# Patient Record
Sex: Female | Born: 1980 | Race: White | Hispanic: No | Smoking: Current every day smoker
Health system: Southern US, Community
[De-identification: ages and names within clinical notes are randomized; demographics above are authoritative.]

---
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
## 2001-12-15 ENCOUNTER — Emergency Department (HOSPITAL_COMMUNITY): Admission: EM | Admit: 2001-12-15 | Discharge: 2001-12-15 | Payer: Self-pay | Admitting: Emergency Medicine

## 2002-07-27 ENCOUNTER — Inpatient Hospital Stay (HOSPITAL_COMMUNITY): Admission: AD | Admit: 2002-07-27 | Discharge: 2002-07-28 | Payer: Self-pay | Admitting: Family Medicine

## 2003-07-13 ENCOUNTER — Emergency Department (HOSPITAL_COMMUNITY): Admission: EM | Admit: 2003-07-13 | Discharge: 2003-07-13 | Payer: Self-pay | Admitting: *Deleted

## 2003-09-13 ENCOUNTER — Emergency Department (HOSPITAL_COMMUNITY): Admission: EM | Admit: 2003-09-13 | Discharge: 2003-09-13 | Payer: Self-pay | Admitting: Emergency Medicine

## 2003-09-17 ENCOUNTER — Emergency Department (HOSPITAL_COMMUNITY): Admission: EM | Admit: 2003-09-17 | Discharge: 2003-09-17 | Payer: Self-pay | Admitting: Family Medicine

## 2003-09-18 ENCOUNTER — Emergency Department (HOSPITAL_COMMUNITY): Admission: EM | Admit: 2003-09-18 | Discharge: 2003-09-18 | Payer: Self-pay | Admitting: Family Medicine

## 2003-09-20 ENCOUNTER — Emergency Department (HOSPITAL_COMMUNITY): Admission: EM | Admit: 2003-09-20 | Discharge: 2003-09-20 | Payer: Self-pay | Admitting: Family Medicine

## 2003-12-19 ENCOUNTER — Emergency Department: Payer: Self-pay | Admitting: Internal Medicine

## 2003-12-27 ENCOUNTER — Emergency Department: Payer: Self-pay | Admitting: Emergency Medicine

## 2004-01-07 ENCOUNTER — Inpatient Hospital Stay (HOSPITAL_COMMUNITY): Admission: AD | Admit: 2004-01-07 | Discharge: 2004-01-07 | Payer: Self-pay | Admitting: *Deleted

## 2004-02-25 ENCOUNTER — Ambulatory Visit: Payer: Self-pay

## 2004-06-24 ENCOUNTER — Observation Stay: Payer: Self-pay

## 2004-06-30 ENCOUNTER — Observation Stay: Payer: Self-pay | Admitting: Obstetrics and Gynecology

## 2004-07-05 ENCOUNTER — Observation Stay: Payer: Self-pay | Admitting: Obstetrics and Gynecology

## 2004-07-06 ENCOUNTER — Observation Stay: Payer: Self-pay | Admitting: Obstetrics and Gynecology

## 2004-07-08 ENCOUNTER — Inpatient Hospital Stay: Payer: Self-pay

## 2004-09-20 ENCOUNTER — Ambulatory Visit: Payer: Self-pay

## 2004-11-26 ENCOUNTER — Other Ambulatory Visit: Payer: Self-pay

## 2004-11-26 ENCOUNTER — Emergency Department: Payer: Self-pay | Admitting: Internal Medicine

## 2005-01-01 ENCOUNTER — Emergency Department: Payer: Self-pay | Admitting: Emergency Medicine

## 2005-05-27 ENCOUNTER — Ambulatory Visit: Payer: Self-pay | Admitting: Family Medicine

## 2005-10-11 ENCOUNTER — Emergency Department: Payer: Self-pay | Admitting: Emergency Medicine

## 2007-05-13 IMAGING — CT CT ABD-PELV W/O CM
1 of 2 series · 15 of 32 positions shown, 19 images · non-contrast
Comparison: none

REASON FOR EXAM: Abdomen and pelvis, evaluate for stones
COMMENTS:

[Series 2: stone · axial · 0.59mm/px · z∈[-1037,-647]mm · 15 of 147 slices shown, 19 images]
[im 11/147  soft-tissue]
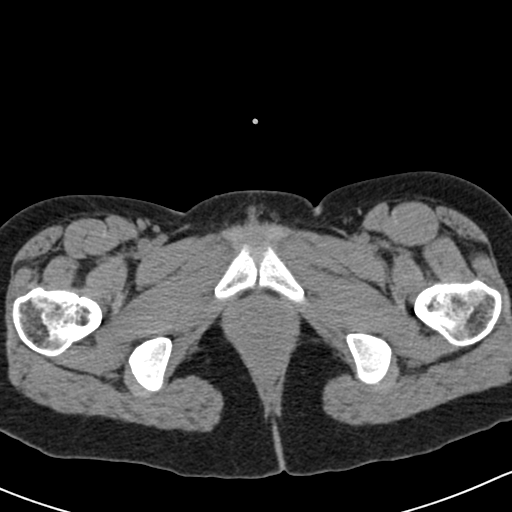
[im 11/147  bone]
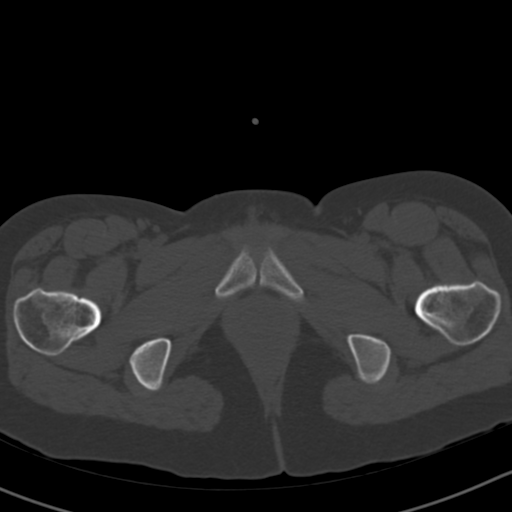
[im 21/147  soft-tissue]
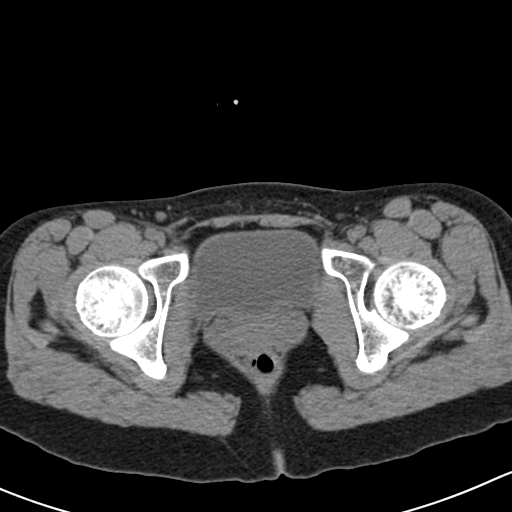
[im 32/147  soft-tissue]
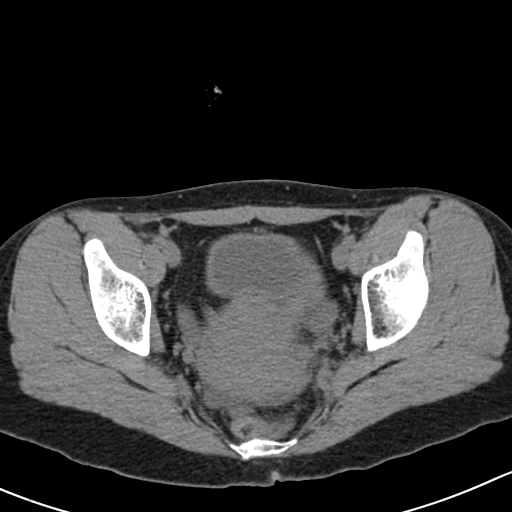
[im 42/147  soft-tissue]
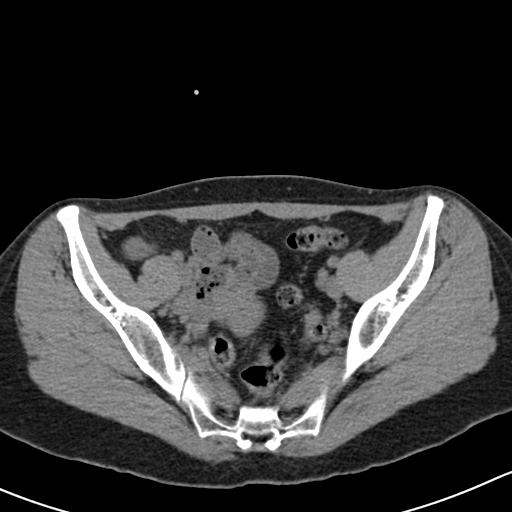
[im 53/147  soft-tissue]
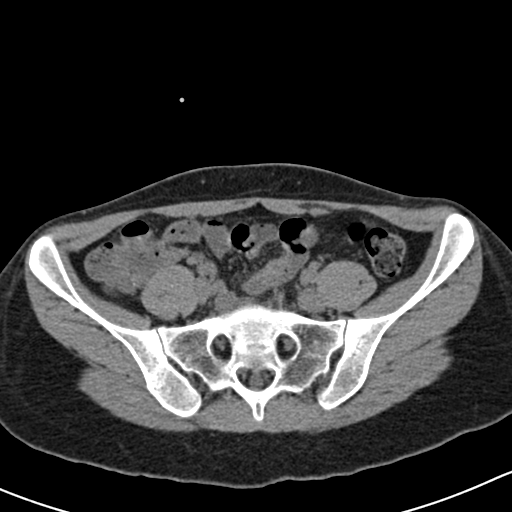
[im 63/147  soft-tissue]
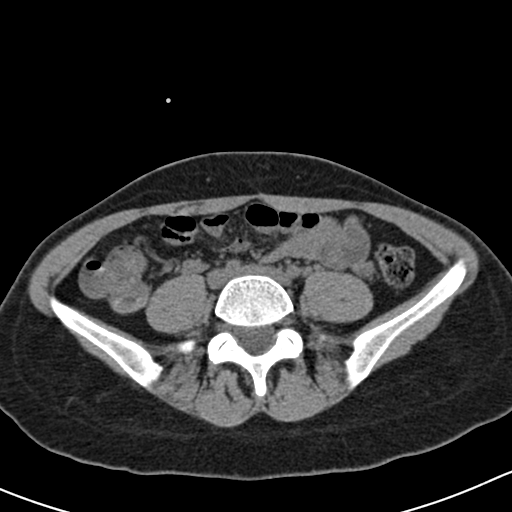
[im 74/147  soft-tissue]
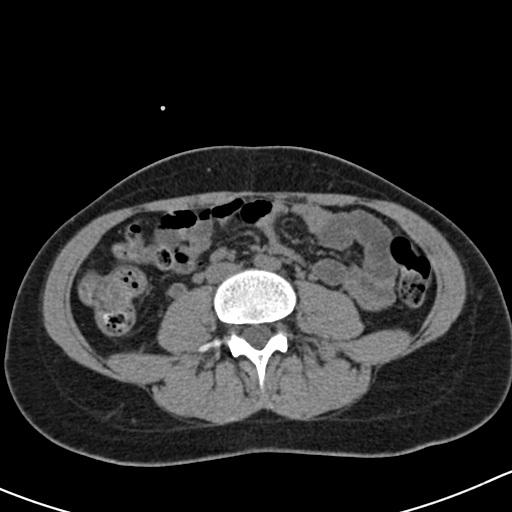
[im 84/147  soft-tissue]
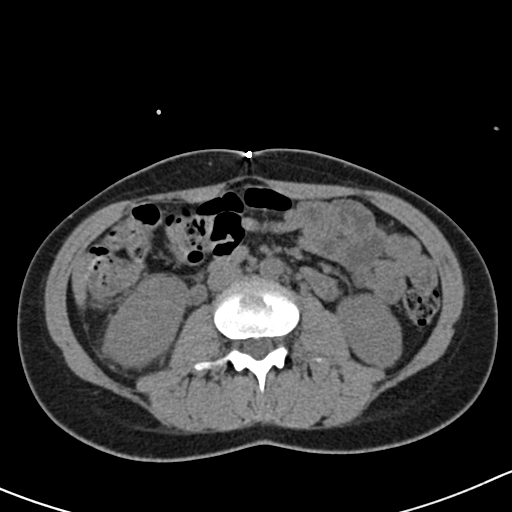
[im 94/147  soft-tissue]
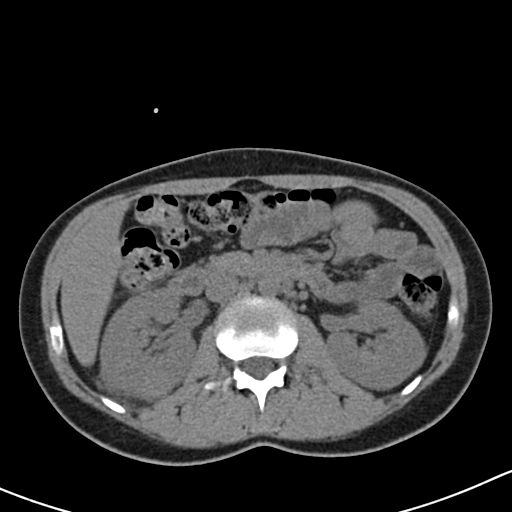
[im 94/147  bone]
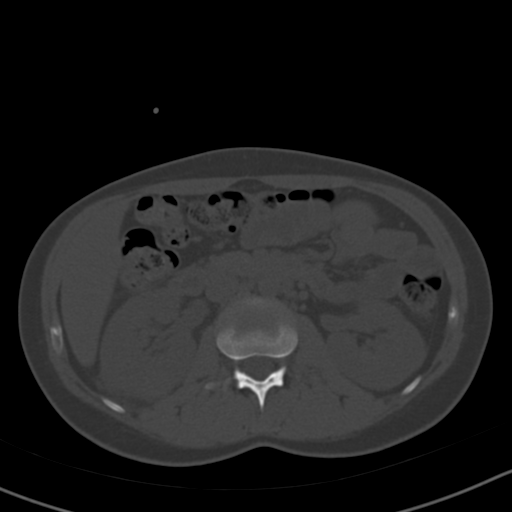
[im 105/147  soft-tissue]
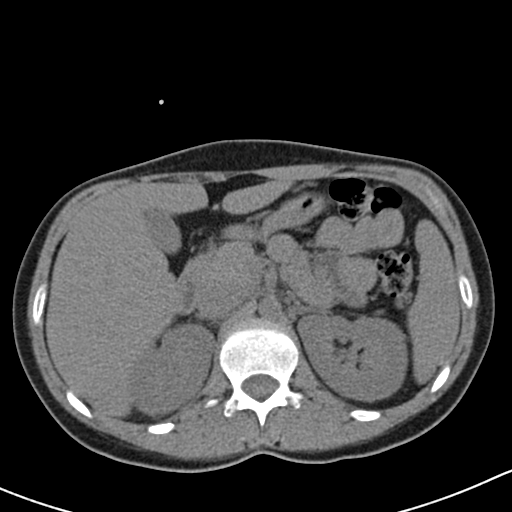
[im 115/147  soft-tissue]
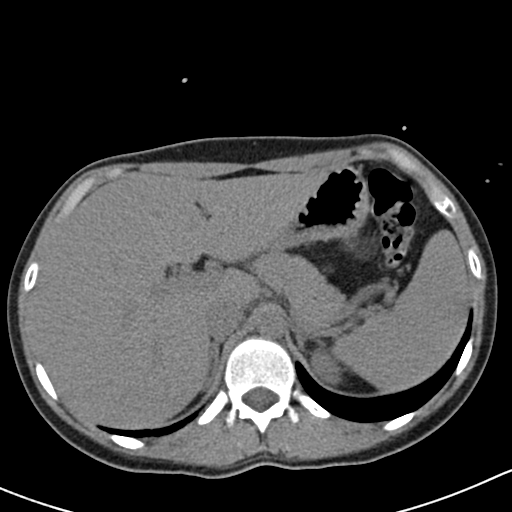
[im 126/147  soft-tissue]
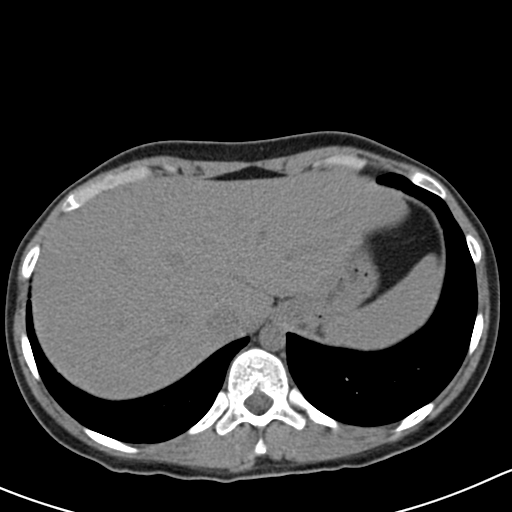
[im 126/147  lung]
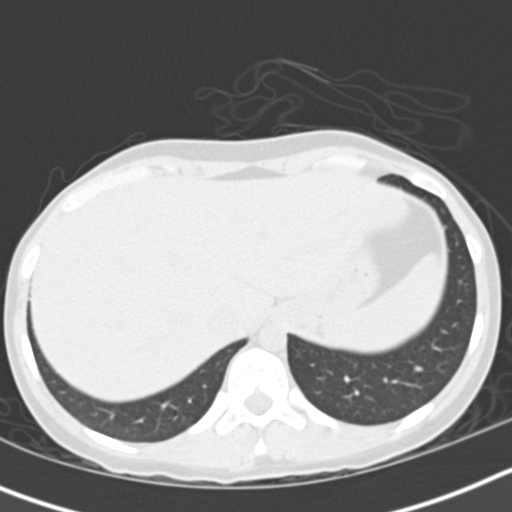
[im 131/147  lung]
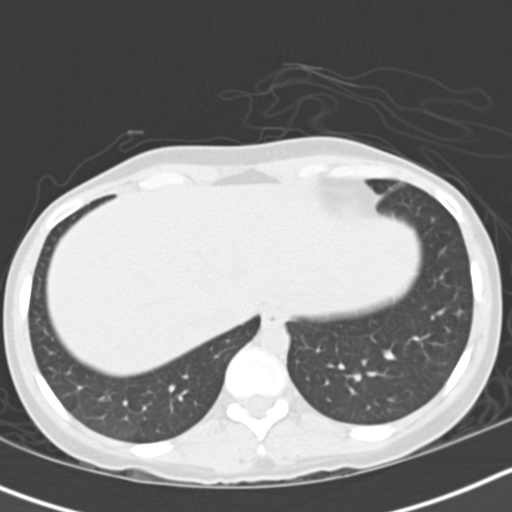
[im 136/147  soft-tissue]
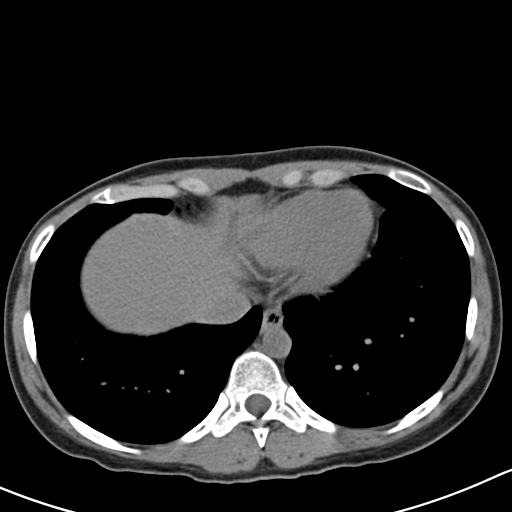
[im 136/147  lung]
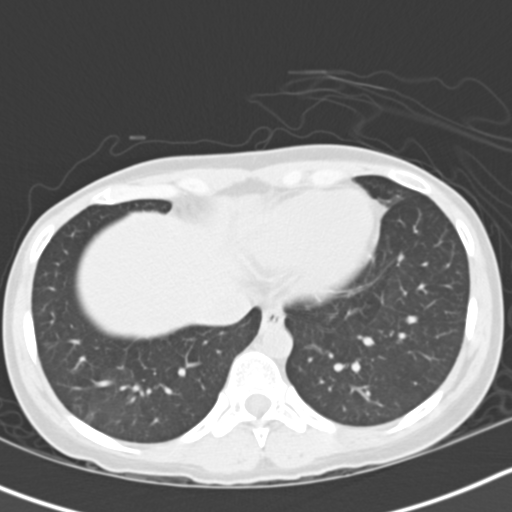
[im 141/147  lung]
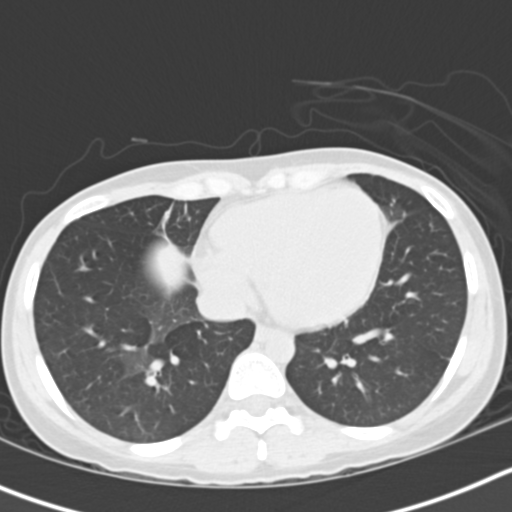

[15 of 32 positions shown; findings below may reference images not displayed]

PROCEDURE:     CT  - CT ABDOMEN AND PELVIS W[DATE]  [DATE]

RESULT:          The patient is complaining of LEFT flank discomfort as well
as dysuria.  The study was tailored to evaluate the patient for urinary
tract stones.

Both RIGHT and LEFT kidneys appear normal in contour.  I do not see evidence
of hydronephrosis.  The perinephric fat density is minimally prominent on
the RIGHT, but on the LEFT it is normal.  Neither kidney exhibits calcified
stones.  Along the expected course of the ureters, I do not see
calcifications nor hydroureter.  The urinary bladder is partially distended
and grossly normal.  The uterus and adnexal structures appear normal for
age.  There is no free fluid in the pelvis.  The unopacified loops of small
and large bowel exhibit no acute abnormality.  I do not see findings
suspicious for acute appendicitis.

The liver, gallbladder, stomach, spleen, adrenal glands, and abdominal aorta
are normal in appearance.  The lung bases are clear.
IMPRESSION: There is very mildly increased density in the
perinephric fat on the RIGHT, but I do not see evidence of hydronephrosis.
Minimal prominence of the proximal RIGHT ureter is noted, but no definite
calcified stone is seen.  A definite transition point in the ureter is not
identified, either.  This may reflect edema from recent passage of a stone
or may reflect unopacified stones.  Correlation with the patient's clinical
exam will be needed.  A followup contrast-enhanced CT scan is available upon
request.

## 2007-07-11 ENCOUNTER — Inpatient Hospital Stay (HOSPITAL_COMMUNITY): Admission: AD | Admit: 2007-07-11 | Discharge: 2007-07-12 | Payer: Self-pay | Admitting: Obstetrics and Gynecology

## 2010-06-04 NOTE — Discharge Summary (Signed)
[facility]  Patient:    Brenda Baker, Brenda Baker                         MRN: [id number] Adm. Date:  [id number] Disc. Date: [id number] Attending:  Tammi Sou CC:         [facility]   Discharge Summary  DATE OF BIRTH:                Dec 01, 1980  REASON FOR ADMISSION:         Preterm labor.  HISTORY OF PRESENT ILLNESS:   This is a 30 year old, G2, P1-0-0-1, at 33 weeks on the date of admission, who reported to [facility] with a history of contractions every two to five minutes for two and a half hours the night of admission. The patients contractions decreased, but she wanted to be checked out.  She was previously seen in [facility]U for preterm contractions and was given subcu terbutaline and IV fluids.  They subsided and she was discharged to home.  The patient did not have any spontaneous rupture of membranes or any bleeding. She did have positive fetal movement and there was no discharge.  OBSTETRICAL HISTORY:          Significant for one prior spontaneous vaginal delivery without complications.  SOCIAL HISTORY:               Negative for alcohol.  She does smoke one pack of cigarettes per day.  She does not use any illicit drugs.  GYNECOLOGICAL HISTORY:        Negative.  PAST MEDICAL HISTORY:         Negative.  PAST SURGICAL HISTORY:        Negative.  MEDICATIONS:                  Iron and prenatal vitamins.  ALLERGIES:                    She has no known drug allergies.  PRENATAL LABORATORY DATA:     Normal.  PHYSICAL EXAMINATION:         Vital signs stable.  She was afebrile.  The fetal heart rate at baseline was 130-140.  Positive accelerations and no decelerations.  She was having uterine contractions every five minutes.  HEENT:                        Benign.  CHEST:                        Benign.  HEART:                        Benign.  ABDOMEN:                      Benign.  PELVIC:                       The cervical exam revealed her  to be 2-3 cm, 30%, -3, and vertex.  This was a change from the exam earlier in the day, which showed her to be fingertip.  Wet prep on admission showed moderate wbcs and all else was negative.  HOSPITAL COURSE:              The patient was admitted to the antenatal unit for continuous  monitoring.  She was started on IV magnesium sulfate for tocolysis.  She did have a protracted course on magnesium sulfate, including changes in the dosage from 2g to 3 g multiple times due to symptoms related to the magnesium and elevated magnesium levels.  She was tried off of her magnesium sulfate one time during the course, at which time she began contracting and again changed her cervix to an exam of 4-5 cm, 50%, and -3. She was restarted on magnesium tocolysis with good control of her contractions.  Throughout her hospitalization, she continued to have some contractions, but did not have any abdominal pain, pelvic pressure, or back pain as she did with her initial set of contractions.  The patient was also started on IV Unasyn for cervicitis due to her moderate white blood cells on wet prep.  During her hospitalization, the patient was placed on a 7 mcg nicotine patch due to her smoking in the bathroom several times and requesting to go outside to smoke.  Due to the patients cervical change, it was felt that this was not in her best interest.  The patient did become compliant with her course with the patches and no further incidents were reported.  The patient also received betamethasone x 2.  After the patients gestational age reached 6, it was felt that given her having received prenatal steroids and reaching this gestational age that if she were to go into active labor, no further precautions will be taken to stop her.  She was therefore discontinued from magnesium sulfate and was monitored b.i.d.  She did have several episodes of contractions with abdominal pain, back pain, and pelvic pressure, but  did not change her cervix with these.  The patient was monitored routinely and while still continuing to have contractions is not longer feeling them. Therefore, she is discharged home without tocolysis.  She was given instructions on labor and when to return to the clinic.  She is to follow up for routine care at high-risk clinic in one week.  PROCEDURES:                   A complete obstetrical ultrasound was performed, which showed polyhydramnios with an amniotic fluid index of 27.  No fetal anomalies were noted.  The infant was appropriate for gestational age.  DISCHARGE MEDICATIONS:        Prenatal vitamins and iron.  ACTIVITY:                     Bed rest with bathroom privileges.  DIET:                         Regular.  FOLLOW-UP:                    She is to follow up in the high-risk clinic in approximately one weeks time.  CONDITION ON DISCHARGE:       Stable and improved. DD:  09/16/99 TD:  09/17/99 Job: [id number]

## 2010-10-14 LAB — URINALYSIS, ROUTINE W REFLEX MICROSCOPIC
Bilirubin Urine: NEGATIVE
Glucose, UA: NEGATIVE
Ketones, ur: NEGATIVE
Nitrite: NEGATIVE
Protein, ur: 100 — AB
Specific Gravity, Urine: 1.01
Urobilinogen, UA: 1
pH: 6

## 2010-10-14 LAB — WET PREP, GENITAL
Trich, Wet Prep: NONE SEEN
Yeast Wet Prep HPF POC: NONE SEEN

## 2010-10-14 LAB — POCT PREGNANCY, URINE
Operator id: 14935
Preg Test, Ur: NEGATIVE

## 2010-10-14 LAB — URINE MICROSCOPIC-ADD ON

## 2017-05-24 ENCOUNTER — Encounter: Payer: Self-pay | Admitting: Psychiatry

## 2017-05-24 ENCOUNTER — Other Ambulatory Visit: Payer: Self-pay

## 2017-05-24 ENCOUNTER — Inpatient Hospital Stay
Admission: AD | Admit: 2017-05-24 | Discharge: 2017-05-26 | DRG: 885 | Disposition: A | Discharge status: Home / Self Care | Payer: No Typology Code available for payment source | Source: Intra-hospital | Attending: Psychiatry | Admitting: Psychiatry

## 2017-05-24 DIAGNOSIS — F332 Major depressive disorder, recurrent severe without psychotic features: Principal | ICD-10-CM | POA: Diagnosis present

## 2017-05-24 DIAGNOSIS — N760 Acute vaginitis: Secondary | ICD-10-CM | POA: Diagnosis present

## 2017-05-24 DIAGNOSIS — F17200 Nicotine dependence, unspecified, uncomplicated: Secondary | ICD-10-CM | POA: Diagnosis present

## 2017-05-24 DIAGNOSIS — Z818 Family history of other mental and behavioral disorders: Secondary | ICD-10-CM | POA: Diagnosis not present

## 2017-05-24 DIAGNOSIS — R45851 Suicidal ideations: Secondary | ICD-10-CM | POA: Diagnosis present

## 2017-05-24 DIAGNOSIS — G4710 Hypersomnia, unspecified: Secondary | ICD-10-CM | POA: Diagnosis present

## 2017-05-24 DIAGNOSIS — F410 Panic disorder [episodic paroxysmal anxiety] without agoraphobia: Secondary | ICD-10-CM | POA: Diagnosis present

## 2017-05-24 DIAGNOSIS — F1010 Alcohol abuse, uncomplicated: Secondary | ICD-10-CM | POA: Diagnosis present

## 2017-05-24 DIAGNOSIS — Z5181 Encounter for therapeutic drug level monitoring: Secondary | ICD-10-CM

## 2017-05-24 MED ORDER — NICOTINE 21 MG/24HR TD PT24
21.0000 mg | MEDICATED_PATCH | Freq: Every day | TRANSDERMAL | Status: DC
  Administered 2017-05-25 – 2017-05-26 (×2): 21 mg via TRANSDERMAL
  Filled 2017-05-24 (×2): qty 1

## 2017-05-24 MED ORDER — HYDROXYZINE HCL 50 MG PO TABS
50.0000 mg | ORAL_TABLET | Freq: Three times a day (TID) | ORAL | Status: DC | PRN
  Administered 2017-05-24 – 2017-05-25 (×2): 50 mg via ORAL
  Filled 2017-05-24 (×2): qty 1

## 2017-05-24 MED ORDER — ALUM & MAG HYDROXIDE-SIMETH 200-200-20 MG/5ML PO SUSP: 30.0000 mL | ORAL | Status: DC | PRN

## 2017-05-24 MED ORDER — ACETAMINOPHEN 325 MG PO TABS: 650.0000 mg | ORAL_TABLET | Freq: Four times a day (QID) | ORAL | Status: DC | PRN

## 2017-05-24 MED ORDER — MAGNESIUM HYDROXIDE 400 MG/5ML PO SUSP: 30.0000 mL | Freq: Every day | ORAL | Status: DC | PRN

## 2017-05-24 MED ORDER — TRAZODONE HCL 100 MG PO TABS
100.0000 mg | ORAL_TABLET | Freq: Every day | ORAL | Status: DC
  Administered 2017-05-24: 100 mg via ORAL
  Filled 2017-05-24: qty 1

## 2017-05-24 NOTE — Plan of Care (Signed)
Patient newly admitted to unit, no goals met at this time.
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
## 2017-05-25 ENCOUNTER — Encounter: Payer: Self-pay | Admitting: Psychiatry

## 2017-05-25 DIAGNOSIS — F332 Major depressive disorder, recurrent severe without psychotic features: Principal | ICD-10-CM

## 2017-05-25 DIAGNOSIS — F17200 Nicotine dependence, unspecified, uncomplicated: Secondary | ICD-10-CM | POA: Diagnosis present

## 2017-05-25 LAB — CBC
HCT: 39.3 % (ref 35.0–47.0)
Hemoglobin: 13.7 g/dL (ref 12.0–16.0)
MCH: 32.2 pg (ref 26.0–34.0)
MCHC: 35 g/dL (ref 32.0–36.0)
MCV: 92 fL (ref 80.0–100.0)
Platelets: 311 10*3/uL (ref 150–440)
RBC: 4.27 MIL/uL (ref 3.80–5.20)
RDW: 14.2 % (ref 11.5–14.5)
WBC: 8.9 10*3/uL (ref 3.6–11.0)

## 2017-05-25 LAB — COMPREHENSIVE METABOLIC PANEL
ALT: 13 U/L — ABNORMAL LOW (ref 14–54)
ANION GAP: 6 (ref 5–15)
AST: 22 U/L (ref 15–41)
Albumin: 4.3 g/dL (ref 3.5–5.0)
Alkaline Phosphatase: 61 U/L (ref 38–126)
BILIRUBIN TOTAL: 0.6 mg/dL (ref 0.3–1.2)
BUN: 7 mg/dL (ref 6–20)
CO2: 29 mmol/L (ref 22–32)
Calcium: 8.9 mg/dL (ref 8.9–10.3)
Chloride: 103 mmol/L (ref 101–111)
Creatinine, Ser: 0.73 mg/dL (ref 0.44–1.00)
Glucose, Bld: 80 mg/dL (ref 65–99)
POTASSIUM: 3.2 mmol/L — AB (ref 3.5–5.1)
Sodium: 138 mmol/L (ref 135–145)
TOTAL PROTEIN: 7.4 g/dL (ref 6.5–8.1)

## 2017-05-25 LAB — URINALYSIS, COMPLETE (UACMP) WITH MICROSCOPIC
BILIRUBIN URINE: NEGATIVE
GLUCOSE, UA: NEGATIVE mg/dL
Hgb urine dipstick: NEGATIVE
KETONES UR: NEGATIVE mg/dL
LEUKOCYTES UA: NEGATIVE
NITRITE: POSITIVE — AB
PH: 6 (ref 5.0–8.0)
Protein, ur: NEGATIVE mg/dL
Specific Gravity, Urine: 1.003 — ABNORMAL LOW (ref 1.005–1.030)

## 2017-05-25 LAB — LIPID PANEL
Cholesterol: 168 mg/dL (ref 0–200)
HDL: 32 mg/dL — ABNORMAL LOW (ref >40)
LDL CALC: 111 mg/dL — AB (ref 0–99)
TRIGLYCERIDES: 124 mg/dL (ref <150)
Total CHOL/HDL Ratio: 5.3 RATIO
VLDL: 25 mg/dL (ref 0–40)

## 2017-05-25 LAB — HEMOGLOBIN A1C
HEMOGLOBIN A1C: 5.1 % (ref 4.8–5.6)
Mean Plasma Glucose: 99.67 mg/dL

## 2017-05-25 LAB — TSH: TSH: 2.226 u[IU]/mL (ref 0.350–4.500)

## 2017-05-25 MED ORDER — CEPHALEXIN 500 MG PO CAPS
500.0000 mg | ORAL_CAPSULE | Freq: Two times a day (BID) | ORAL | Status: DC
  Administered 2017-05-25 – 2017-05-26 (×2): 500 mg via ORAL
  Filled 2017-05-25 (×2): qty 1

## 2017-05-25 MED ORDER — SERTRALINE HCL 50 MG PO TABS: 50.0000 mg | ORAL_TABLET | Freq: Every day | ORAL | 1 refills | Status: AC

## 2017-05-25 MED ORDER — SERTRALINE HCL 25 MG PO TABS
50.0000 mg | ORAL_TABLET | Freq: Every day | ORAL | Status: DC
  Administered 2017-05-25 – 2017-05-26 (×2): 50 mg via ORAL
  Filled 2017-05-25 (×2): qty 2

## 2017-05-25 MED ORDER — TRAZODONE HCL 100 MG PO TABS: 100.0000 mg | ORAL_TABLET | Freq: Every evening | ORAL | Status: DC | PRN

## 2017-05-25 MED ORDER — CEPHALEXIN 500 MG PO CAPS: 500.0000 mg | ORAL_CAPSULE | Freq: Two times a day (BID) | ORAL | 0 refills | Status: AC

## 2017-05-25 MED ORDER — HYDROXYZINE HCL 50 MG PO TABS: 50.0000 mg | ORAL_TABLET | Freq: Three times a day (TID) | ORAL | 1 refills | Status: AC | PRN

## 2017-05-25 MED ORDER — FLUCONAZOLE 100 MG PO TABS
200.0000 mg | ORAL_TABLET | Freq: Once | ORAL | Status: AC
  Administered 2017-05-25: 200 mg via ORAL
  Filled 2017-05-25: qty 2

## 2017-05-25 NOTE — Progress Notes (Signed)
Patient is alert and oriented x 4, denies distress or pain, denies SI/HI/AVH. Thoughts are organized and coherent. Patient is complaint with medication, interacting appropriately with peers and staff no distress noted. 15 minutes safety checks maintained will continue to monitor.
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
## 2017-05-25 NOTE — BHH Group Notes (Signed)
  05/25/2017  Time: 1PM  Type of Therapy/Topic:  Group Therapy:  Balance in Life  Participation Level:  Active  Description of Group:   This group will address the concept of balance and how it feels and looks when one is unbalanced. Patients will be encouraged to process areas in their lives that are out of balance and identify reasons for remaining unbalanced. Facilitators will guide patients in utilizing problem-solving interventions to address and correct the stressor making their life unbalanced. Understanding and applying boundaries will be explored and addressed for obtaining and maintaining a balanced life. Patients will be encouraged to explore ways to assertively make their unbalanced needs known to significant others in their lives, using other group members and facilitator for support and feedback.  Therapeutic Goals: 1. Patient will identify two or more emotions or situations they have that consume much of in their lives. 2. Patient will identify signs/triggers that life has become out of balance:  3. Patient will identify two ways to set boundaries in order to achieve balance in their lives:  4. Patient will demonstrate ability to communicate their needs through discussion and/or role plays  Summary of Patient Progress: Pt continues to work towards their tx goals but has not yet reached them. Pt was able to appropriately participate in group discussion, and was able to offer support/validation to other group members. Pt reported one area of her life she feels she devotes too much attention to is, "working because I stress about money." Pt reported one area of her life she'd like to devote more attention to is, "my family and doing something I enjoy--like a hobby."     Therapeutic Modalities:   Cognitive Behavioral Therapy Solution-Focused Therapy Assertiveness Training  Heidi Dach, MSW, LCSW Clinical Social Worker 05/25/2017 2:05 PM

## 2017-05-25 NOTE — Plan of Care (Signed)
  Problem: Coping: Goal: Coping ability will improve Outcome: Progressing Goal: Will verbalize feelings Outcome: Progressing  Patient is interacting with peers appropriately, appears less anxious

## 2017-05-25 NOTE — BHH Group Notes (Signed)
LCSW Group Therapy Note 05/25/2017 9:00 AM  Type of Therapy and Topic:  Group Therapy:  Setting Goals  Participation Level:  Active  Description of Group: In this process group, patients discussed using strengths to work toward goals and address challenges.  Patients identified two positive things about themselves and one goal they were working on.  Patients were given the opportunity to share openly and support each other's plan for self-empowerment.  The group discussed the value of gratitude and were encouraged to have a daily reflection of positive characteristics or circumstances.  Patients were encouraged to identify a plan to utilize their strengths to work on current challenges and goals.  Therapeutic Goals 1. Patient will verbalize personal strengths/positive qualities and relate how these can assist with achieving desired personal goals 2. Patients will verbalize affirmation of peers plans for personal change and goal setting 3. Patients will explore the value of gratitude and positive focus as related to successful achievement of goals 4. Patients will verbalize a plan for regular reinforcement of personal positive qualities and circumstances.  Summary of Patient Progress:  Brenda Baker was able to actively participate in today's group discussion on setting goals using the SMART Model.  Brenda Baker appeared to have a good understanding of how she can use the SMART Model to help her to establish her own goals for both while in the hospital and whenever she is discharged.  Brenda Baker shared that a goal that she would like to start working on while in the hospital is "work on better self-care.  I didn't sleep well last night and woke up early today so I'm going to take a nap in order to feel refreshed".     Therapeutic Modalities Cognitive Behavioral Therapy Motivational Interviewing    Brenda Baker, [place] 05/25/2017 10:42 AM

## 2017-05-25 NOTE — BHH Suicide Risk Assessment (Signed)
[facility] Discharge Suicide Risk Assessment   Principal Problem: Major depressive disorder, recurrent severe without psychotic features [facility]) Discharge Diagnoses:  Patient Active Problem List   Diagnosis Date Noted  . Major depressive disorder, recurrent severe without psychotic features ([facility]) [F33.2] 05/24/2017    Priority: High  . Tobacco use disorder [F17.200] 05/25/2017  . Suicidal ideation [R45.851] 05/24/2017    Total Time spent with patient: 20 minutes plus 15 min on care coordination and documentation  Musculoskeletal: Strength & Muscle Tone: within normal limits Gait & Station: normal Patient leans: N/A  Psychiatric Specialty Exam: Review of Systems  Neurological: Negative.   Psychiatric/Behavioral: Positive for substance abuse. The patient is nervous/anxious.   All other systems reviewed and are negative.   Blood pressure 129/87, pulse 81, temperature 98.6 F (37 C), temperature source Oral, resp. rate 20, height  (1.575 m), weight 58.1 kg (128 lb), last menstrual period 05/18/2017, SpO2 100 %.Body mass index is 23.41 kg/m.  General Appearance: Casual  Eye Contact::  Good  Speech:  Clear and Coherent409  Volume:  Normal  Mood:  Euthymic  Affect:  Appropriate  Thought Process:  Goal Directed and Descriptions of Associations: Intact  Orientation:  Full (Time, Place, and Person)  Thought Content:  WDL  Suicidal Thoughts:  No  Homicidal Thoughts:  No  Memory:  Immediate;   Fair Recent;   Fair Remote;   Fair  Judgement:  Impaired  Insight:  Shallow  Psychomotor Activity:  Normal  Concentration:  Fair  Recall:  [employer] of Knowledge:Fair  Language: Fair  Akathisia:  No  Handed:  Right  AIMS (if indicated):     Assets:  Communication Skills Desire for Improvement Housing Physical Health Resilience Social Support Transportation Vocational/Educational  Sleep:  Number of Hours: 7.75  Cognition: WNL  ADL's:  Intact   Mental Status Per Nursing  Assessment::   On Admission:  NA  Demographic Factors:  Divorced or widowed and Caucasian  Loss Factors: Loss of significant relationship and Legal issues  Historical Factors: Family history of mental illness or substance abuse and Impulsivity  Risk Reduction Factors:   Responsible for children under 7 years of age, Sense of responsibility to family, Employed, Living with another person, especially a relative and Positive social support  Continued Clinical Symptoms:  Depression:   Comorbid alcohol abuse/dependence Impulsivity Alcohol/Substance Abuse/Dependencies  Cognitive Features That Contribute To Risk:  None    Suicide Risk:  Minimal: No identifiable suicidal ideation.  Patients presenting with no risk factors but with morbid ruminations; may be classified as minimal risk based on the severity of the depressive symptoms  Follow-up Information    [facility]. Go on 06/01/2017.   Why:  Please follow up with [facility] on Thursday Jun 01, 2017 at 9:45am. Please bring your identification, Social Security Card and Proof of income. Thank you. Contact information: [address] [place] [address] [phone]           Plan Of Care/Follow-up recommendations:  Activity:  as tolerated Diet:  low sodium heart healthy Other:  keep follow up appointments  Kristine Linea, MD 05/26/2017, 8:44 AM

## 2017-05-25 NOTE — BHH Suicide Risk Assessment (Signed)
[facility] INPATIENT:  Family/Significant Other Suicide Prevention Education  Suicide Prevention Education:  Patient Refusal for Family/Significant Other Suicide Prevention Education: The patient Brenda Baker has refused to provide written consent for family/significant other to be provided Family/Significant Other Suicide Prevention Education during admission and/or prior to discharge.  Physician notified.  Johny Shears 05/25/2017, 11:34 AM
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
## 2017-05-25 NOTE — BHH Group Notes (Signed)
BHH Group Notes:  (Nursing/MHT/Case Management/Adjunct)  Date:  05/25/2017  Time:  12:31 AM  Type of Therapy:  Group Therapy  Participation Level:  Did Not Attend   Summary of Progress/Problems:  Brenda Baker 05/25/2017, 12:31 AM

## 2017-05-25 NOTE — BHH Suicide Risk Assessment (Signed)
[facility] Admission Suicide Risk Assessment   Nursing information obtained from:  Patient(verbally denies SI at this time) Demographic factors:  Caucasian Current Mental Status:  NA Loss Factors:  Loss of significant relationship Historical Factors:  NA Risk Reduction Factors:  Responsible for children under 36 years of age, Sense of responsibility to family, Positive therapeutic relationship, Positive social support  Total Time spent with patient: 1 hour Principal Problem: Major depressive disorder, recurrent severe without psychotic features (HCC) Diagnosis:   Patient Active Problem List   Diagnosis Date Noted  . Major depressive disorder, recurrent severe without psychotic features (HCC) [F33.2] 05/24/2017    Priority: High  . Suicidal ideation [R45.851] 05/24/2017   Subjective Data: suicidal ideation  Continued Clinical Symptoms:  Alcohol Use Disorder Identification Test Final Score (AUDIT): 0 The "Alcohol Use Disorders Identification Test", Guidelines for Use in Primary Care, Second Edition.  World Science writer [facility]). Score between 0-7:  no or low risk or alcohol related problems. Score between 8-15:  moderate risk of alcohol related problems. Score between 16-19:  high risk of alcohol related problems. Score 20 or above:  warrants further diagnostic evaluation for alcohol dependence and treatment.   CLINICAL FACTORS:   Depression:   Comorbid alcohol abuse/dependence Impulsivity Alcohol/Substance Abuse/Dependencies   Musculoskeletal: Strength & Muscle Tone: within normal limits Gait & Station: normal Patient leans: N/A  Psychiatric Specialty Exam: Physical Exam  Nursing note and vitals reviewed. Psychiatric: Her speech is normal and behavior is normal. Thought content normal. Her mood appears anxious. Cognition and memory are normal. She expresses impulsivity.    Review of Systems  Neurological: Negative.   Psychiatric/Behavioral: Positive for depression and  substance abuse.  All other systems reviewed and are negative.   Blood pressure 102/80, pulse 92, temperature 97.8 F (36.6 C), temperature source Oral, resp. rate 18, height  (1.575 m), weight 58.1 kg (128 lb), last menstrual period 05/18/2017, SpO2 100 %.Body mass index is 23.41 kg/m.  General Appearance: Casual  Eye Contact:  Good  Speech:  Clear and Coherent  Volume:  Normal  Mood:  Depressed  Affect:  Inappropriate  Thought Process:  Goal Directed and Descriptions of Associations: Intact  Orientation:  Full (Time, Place, and Person)  Thought Content:  WDL  Suicidal Thoughts:  No  Homicidal Thoughts:  No  Memory:  Immediate;   Fair Recent;   Fair Remote;   Fair  Judgement:  Impaired  Insight:  Shallow  Psychomotor Activity:  Normal  Concentration:  Concentration: Fair and Attention Span: Fair  Recall:  [employer] of Knowledge:  Fair  Language:  Fair  Akathisia:  No  Handed:  Right  AIMS (if indicated):     Assets:  Communication Skills Desire for Improvement Housing Physical Health Social Support Transportation Vocational/Educational  ADL's:  Intact  Cognition:  WNL  Sleep:  Number of Hours: 7.5      COGNITIVE FEATURES THAT CONTRIBUTE TO RISK:  None    SUICIDE RISK:   Minimal: No identifiable suicidal ideation.  Patients presenting with no risk factors but with morbid ruminations; may be classified as minimal risk based on the severity of the depressive symptoms  PLAN OF CARE: hospital admission, medication management, substance abuse counseling, discharge planning.  Ms. Michl is a 37 year old female with a history of untreated depression presents with suicidal ideation and a plan to cut herself.  #Suicidal ideation -passing suicidal thoughts withou intention -patient able to contract for safety in the hospital  #Mood/anxiety -start Zoloft  50 mg daily -Vistaril 50 mg PRN  #Alcohol abuse -denies daily drinking -declines  treatment  #Disposition -discharge to home with her parents -keep follow up appointments  I certify that inpatient services furnished can reasonably be expected to improve the patient's condition.   Kristine Linea, MD 05/25/2017, 3:49 PM

## 2017-05-25 NOTE — Progress Notes (Addendum)
Recreation Therapy Notes   Date: 05/25/2017  Time: 9:30 am  Location: Craft Room  Behavioral response: Appropriate   Intervention Topic: Coping Skills  Discussion/Intervention:  Group content on today was focused on coping skills. The group defined what coping skills are and when they can be used. Individuals described how they normally cope with thing and the coping skills they normally use. Patients expressed why it is important to cope with things and how not coping with things can affect you. The group participated in the intervention "Exploring coping skills" where they had a chance to test new coping skills they could use in the future.  Clinical Observations/Feedback:  Patient came to group and defined coping as things she does to cope with something she is dealing with. She identified reading, going for a walk, taking a shower, and listening to music as her coping skills. Individual participated in the intervention and was social with peers and staff during group.  Austin Pongratz LRT/CTRS         Helaina Stefano 05/25/2017 11:23 AM

## 2017-05-25 NOTE — Plan of Care (Signed)
Patient is up ad lib with steady gait alert and oriented x 4. Denies SI/HI/AVH and pain at this time. Reports that she slept goo last night without the use of a sleep-aid. Appetite is fair and her concentration is goo. Patient also states that her depression and feelings of hopelessness is rated at a 4 and denies being anxious. Her goal for today is to go home but has not developed a discharge plan as of yet. Milieu remains safe with q 15 minute safety checks. Will continue to monitor and report any abnormalities to the MD.
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
## 2017-05-25 NOTE — Tx Team (Signed)
Initial Treatment Plan 05/25/2017 1:30 PM Brenda Baker ZOX:[id number]    PATIENT STRESSORS: Financial difficulties Loss of relationship with significant other Marital or family conflict   PATIENT STRENGTHS: Ability for insight Motivation for treatment/growth Supportive family/friends Work skills   PATIENT IDENTIFIED PROBLEMS: Depression  Financial difficulties  Need for independent living                 DISCHARGE CRITERIA:  Improved stabilization in mood, thinking, and/or behavior Verbal commitment to aftercare and medication compliance  PRELIMINARY DISCHARGE PLAN: Outpatient therapy Return to previous living arrangement  PATIENT/FAMILY INVOLVEMENT: This treatment plan has been presented to and reviewed with the patient, Brenda Baker, and/or family member.  The patient and family have been given the opportunity to ask questions and make suggestions.  Leverne Amrhein L Jaelynne Hockley, RN 05/25/2017, 1:30 PM

## 2017-05-26 NOTE — Progress Notes (Signed)
.  Patient is alert and oriented x 4, denies pain, denies SI/HI/AVH. Patient's thoughts are organized and coherent no bizarre behavior noted, Patient is complaint with medication, interacting appropriately with peers and staff no distress noted. Patient was given support and encouraged to attend evening wrap up group. Patient attends evening wrap up group, interacted appropriately with peers and staff. 15 minutes safety checks maintained will continue to monitor.

## 2017-05-26 NOTE — Progress Notes (Signed)
Recreation Therapy Notes  Date: 05/26/2017  Time: 9:30 am  Location: Craft Room  Behavioral response: Appropriate   Intervention Topic: Leisure  Discussion/Intervention:  Group content today was focused on leisure. The group defined what leisure is and some positive leisure activities they participate in. Individuals identified the difference between good and bad leisure. Participants expressed how they feel after participating in the leisure of their choice. The group discussed how they go about picking a leisure activity and if others are involved in their leisure activities. The patient stated how many leisure activities they too choose from and reasons why it is important to have leisure time. Individuals participated in the intervention "Leisure Jeopardy" where they had a chance to identify new leisure activities as well as benefits of leisure. Clinical Observations/Feedback:  Patient came to group and defined leisure as something that relaxes her. She explained that she decides what leisure to participate in based on what she is in the mood for. Individual identified reading as a leisure she really enjoys. Participant was social with peers and staff while engaging in the activity.         Milana Salay 05/26/2017 11:23 AM

## 2017-05-26 NOTE — Plan of Care (Signed)
  Problem: Coping: Goal: Coping ability will improve Outcome: Progressing Goal: Will verbalize feelings Outcome: Progressing  Patient appears less anxious, interacting appropriately, attending groups, complaint with medication, no distress noted.

## 2017-05-26 NOTE — Progress Notes (Signed)
Data: Patient is alert and oriented, denies SI/HI/AVH at this time. Patient is appropriate and cooperative. Patient has no complaints and a pain rating of 0/10. Patient reports good sleep, appetite is good. Patient rates depression 2/10 , Feelings of hopelessness 0/10 and Anxiety 0/10. Patients goal for today is be discharged home to family.  Action:  Q x 15 minute observation checks were completed for safety. Patient was provided with education on medications. Patient was offered support and encouragement. Patient was given scheduled medications. Patient  was encourage to attend groups, participate in unit activities and continue with plan of care.   Response: Patient is med compliant and attends group. Patient has no complaints at this time. Patient is receptive to treatment and safety maintained on unit. Patient is prepared for discharge.

## 2017-05-26 NOTE — Tx Team (Signed)
Interdisciplinary Treatment and Diagnostic Plan Update  05/26/2017 Time of Session: 10:30am Brenda Baker MRN: [id number]  Principal Diagnosis: Major depressive disorder, recurrent severe without psychotic features [facility])  Secondary Diagnoses: Principal Problem:   Major depressive disorder, recurrent severe without psychotic features (HCC) Active Problems:   Suicidal ideation   Tobacco use disorder   Current Medications:  Current Facility-Administered Medications  Medication Dose Route Frequency Provider Last Rate Last Dose  . acetaminophen (TYLENOL) tablet 650 mg  650 mg Oral Q6H PRN Pucilowska, Jolanta B, MD      . alum & mag hydroxide-simeth (MAALOX/MYLANTA) 200-200-20 MG/5ML suspension 30 mL  30 mL Oral Q4H PRN Pucilowska, Jolanta B, MD      . cephALEXin (KEFLEX) capsule 500 mg  500 mg Oral Q12H Pucilowska, Jolanta B, MD   500 mg at 05/26/17 0856  . hydrOXYzine (ATARAX/VISTARIL) tablet 50 mg  50 mg Oral TID PRN Pucilowska, Jolanta B, MD   50 mg at 05/25/17 2135  . magnesium hydroxide (MILK OF MAGNESIA) suspension 30 mL  30 mL Oral Daily PRN Pucilowska, Jolanta B, MD      . nicotine (NICODERM CQ - dosed in mg/24 hours) patch 21 mg  21 mg Transdermal Q0600 Pucilowska, Jolanta B, MD   21 mg at 05/26/17 0600  . sertraline (ZOLOFT) tablet 50 mg  50 mg Oral Daily Pucilowska, Jolanta B, MD   50 mg at 05/26/17 0856  . traZODone (DESYREL) tablet 100 mg  100 mg Oral QHS PRN Pucilowska, Jolanta B, MD       PTA Medications: No medications prior to admission.    Patient Stressors: Financial difficulties Loss of relationship with significant other Marital or family conflict  Patient Strengths: Ability for insight Motivation for treatment/growth Supportive family/friends Work skills  Treatment Modalities: Medication Management, Group therapy, Case management,  1 to 1 session with clinician, Psychoeducation, Recreational therapy.   Physician Treatment Plan for Primary Diagnosis: Major  depressive disorder, recurrent severe without psychotic features (HCC) Long Term Goal(s): Improvement in symptoms so as ready for discharge Improvement in symptoms so as ready for discharge   Short Term Goals: Ability to identify changes in lifestyle to reduce recurrence of condition will improve Ability to verbalize feelings will improve Ability to disclose and discuss suicidal ideas Ability to demonstrate self-control will improve Ability to identify and develop effective coping behaviors will improve Ability to maintain clinical measurements within normal limits will improve Ability to identify triggers associated with substance abuse/mental health issues will improve Ability to identify changes in lifestyle to reduce recurrence of condition will improve Ability to demonstrate self-control will improve Ability to identify triggers associated with substance abuse/mental health issues will improve  Medication Management: Evaluate patient's response, side effects, and tolerance of medication regimen.  Therapeutic Interventions: 1 to 1 sessions, Unit Group sessions and Medication administration.  Evaluation of Outcomes: Progressing  Physician Treatment Plan for Secondary Diagnosis: Principal Problem:   Major depressive disorder, recurrent severe without psychotic features (HCC) Active Problems:   Suicidal ideation   Tobacco use disorder  Long Term Goal(s): Improvement in symptoms so as ready for discharge Improvement in symptoms so as ready for discharge   Short Term Goals: Ability to identify changes in lifestyle to reduce recurrence of condition will improve Ability to verbalize feelings will improve Ability to disclose and discuss suicidal ideas Ability to demonstrate self-control will improve Ability to identify and develop effective coping behaviors will improve Ability to maintain clinical measurements within normal limits will improve Ability  to identify triggers associated  with substance abuse/mental health issues will improve Ability to identify changes in lifestyle to reduce recurrence of condition will improve Ability to demonstrate self-control will improve Ability to identify triggers associated with substance abuse/mental health issues will improve     Medication Management: Evaluate patient's response, side effects, and tolerance of medication regimen.  Therapeutic Interventions: 1 to 1 sessions, Unit Group sessions and Medication administration.  Evaluation of Outcomes: Progressing   RN Treatment Plan for Primary Diagnosis: Major depressive disorder, recurrent severe without psychotic features (HCC) Long Term Goal(s): Knowledge of disease and therapeutic regimen to maintain health will improve  Short Term Goals: Ability to verbalize feelings will improve, Ability to identify and develop effective coping behaviors will improve and Compliance with prescribed medications will improve  Medication Management: RN will administer medications as ordered by provider, will assess and evaluate patient's response and provide education to patient for prescribed medication. RN will report any adverse and/or side effects to prescribing provider.  Therapeutic Interventions: 1 on 1 counseling sessions, Psychoeducation, Medication administration, Evaluate responses to treatment, Monitor vital signs and CBGs as ordered, Perform/monitor CIWA, COWS, AIMS and Fall Risk screenings as ordered, Perform wound care treatments as ordered.  Evaluation of Outcomes: Progressing   LCSW Treatment Plan for Primary Diagnosis: Major depressive disorder, recurrent severe without psychotic features (HCC) Long Term Goal(s): Safe transition to appropriate next level of care at discharge, Engage patient in therapeutic group addressing interpersonal concerns.  Short Term Goals: Engage patient in aftercare planning with referrals and resources, Increase social support, Identify triggers  associated with mental health/substance abuse issues and Increase skills for wellness and recovery  Therapeutic Interventions: Assess for all discharge needs, 1 to 1 time with Social worker, Explore available resources and support systems, Assess for adequacy in community support network, Educate family and significant other(s) on suicide prevention, Complete Psychosocial Assessment, Interpersonal group therapy.  Evaluation of Outcomes: Progressing   Progress in Treatment: Attending groups: Yes. Participating in groups: Yes. Taking medication as prescribed: Yes. Toleration medication: Yes. Family/Significant other contact made: No, will contact:  Patient refused Patient understands diagnosis: Yes. Discussing patient identified problems/goals with staff: Yes. Medical problems stabilized or resolved: Yes. Denies suicidal/homicidal ideation: Yes. Issues/concerns per patient self-inventory: No. Other:   New problem(s) identified: No, Describe:  None  New Short Term/Long Term Goal(s): "To return home and spend time with my children and take my medications."  Discharge Plan or Barriers: To return home and follow up with [facility] in [place].  Reason for Continuation of Hospitalization: Depression Medication stabilization  Estimated Length of Stay: Discharging Today  Attendees: Patient: Brenda Baker 05/26/2017 11:26 AM  Physician: Dr. Jennet Maduro, MD 05/26/2017 11:26 AM  Nursing:  05/26/2017 11:26 AM  RN Care Manager: 05/26/2017 11:26 AM  Social Worker: Johny Shears, LCSWA 05/26/2017 11:26 AM  Recreational Therapist: Danella Deis. Dreama Saa, LRT 05/26/2017 11:26 AM  Other: Heidi Dach, LCSW 05/26/2017 11:26 AM  Other: Huey Romans, LCSW 05/26/2017 11:26 AM  Other: 05/26/2017 11:26 AM    Scribe for Treatment Team: Johny Shears, LCSW 05/26/2017 11:26 AM

## 2017-05-26 NOTE — Progress Notes (Signed)
Patient was provided with discharge summary, Transition packet, and Suicide Risk Assessment. Verbalized discharge summary, transition packet and suicide risk assessment, patient made aware of any change in medications, and all upcoming appointments.   Patient belongings/money returned. Rx provided to patient.  Patient denied any SI, denies plans for self harm or the harm of others. Patient is calm, with appropriate affect and eye contact. Patient reports no complaints at this time.   Patient will be discharged to Mother @ [phone]

## 2017-05-26 NOTE — Progress Notes (Signed)
  [facility] Adult Case Management Discharge Plan :  Will you be returning to the same living situation after discharge:  Yes,  Back to live with mother At discharge, do you have transportation home?: Yes,  Family will come at discharge Do you have the ability to pay for your medications: Yes,  Referred to a provider who can assist  Release of information consent forms completed and in the chart;  Patient's signature needed at discharge.  Patient to Follow up at: Follow-up Information    [facility] [employer] Go on 06/01/2017.   Why:  Please follow up with [facility] on Thursday Jun 01, 2017 at 9:45am. Please bring your identification, Social Security Card and Proof of income. Thank you. Contact information: [address] [place] [address] [phone]           Next level of care provider has access to [facility] Link:no  Safety Planning and Suicide Prevention discussed: Yes,  Completed with patient  Have you used any form of tobacco in the last 30 days? (Cigarettes, Smokeless Tobacco, Cigars, and/or Pipes): No  Has patient been referred to the Quitline?: N/A patient is not a smoker  Patient has been referred for addiction treatment: Yes  Johny Shears, LCSW 05/26/2017, 10:23 AM
